# Patient Record
Sex: Female | Born: 1995 | Race: Black or African American | Hispanic: No | Marital: Single | Smoking: Never smoker
Health system: Southern US, Community
[De-identification: ages and names within clinical notes are randomized; demographics above are authoritative.]

---

## 2016-06-30 ENCOUNTER — Encounter (HOSPITAL_COMMUNITY): Payer: Self-pay | Admitting: Emergency Medicine

## 2016-06-30 ENCOUNTER — Ambulatory Visit (HOSPITAL_COMMUNITY)
Admission: EM | Admit: 2016-06-30 | Discharge: 2016-06-30 | Disposition: A | Discharge status: Home / Self Care | Attending: Family Medicine | Admitting: Family Medicine

## 2016-06-30 DIAGNOSIS — H6122 Impacted cerumen, left ear: Secondary | ICD-10-CM | POA: Diagnosis not present

## 2016-06-30 DIAGNOSIS — J209 Acute bronchitis, unspecified: Secondary | ICD-10-CM

## 2016-06-30 DIAGNOSIS — J0100 Acute maxillary sinusitis, unspecified: Secondary | ICD-10-CM

## 2016-06-30 MED ORDER — HYDROCODONE-HOMATROPINE 5-1.5 MG/5ML PO SYRP
5.0000 mL | ORAL_SOLUTION | Freq: Four times a day (QID) | ORAL | 0 refills | Status: DC | PRN
Start: 2016-06-30 — End: 2018-08-23

## 2016-06-30 MED ORDER — AZITHROMYCIN 250 MG PO TABS: 250.0000 mg | ORAL_TABLET | Freq: Every day | ORAL | 0 refills | Status: DC

## 2016-06-30 NOTE — ED Provider Notes (Signed)
[facility]    CSN: [id number] Arrival date & time: 06/30/16  1642  First Provider Contact:  None       History   Chief Complaint Chief Complaint  Patient presents with  . URI    HPI Leah Soto is a 20 y.o. female.   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 She comes in with 10 days of sinus congestion and cough. She has productive phlegm which is either tanner green at times.  She's not had a significant fever, shortness of breath, or history of asthma.      History reviewed. No pertinent past medical history.  There are no active problems to display for this patient.   History reviewed. No pertinent surgical history.  OB History    No data available       Home Medications    Prior to Admission medications   Medication Sig Start Date End Date Taking? Authorizing Provider  OVER THE COUNTER MEDICATION otc cold medicines   Yes Historical Provider, MD  azithromycin (ZITHROMAX) 250 MG tablet Take 1 tablet (250 mg total) by mouth daily. Take first 2 tablets together, then 1 every day until finished. 06/30/16   Elvina SidleKurt Katonya Blecher, MD  HYDROcodone-homatropine [facility](HYCODAN) 5-1.5 MG/5ML syrup Take 5 mLs by mouth every 6 (six) hours as needed for cough. 06/30/16   Elvina SidleKurt Brenner Visconti, MD    Family History Family History  Problem Relation Age of Onset  . Diabetes Maternal Grandmother     Social History Social History  Substance Use Topics  . Smoking status: Never Smoker  . Smokeless tobacco: Never Used  . Alcohol use No     Allergies   Review of patient's allergies indicates no known allergies.   Review of Systems Review of Systems  Constitutional: Negative for fever.  HENT: Positive for congestion and sinus pressure.   Eyes: Negative.   Respiratory: Positive for cough.   Cardiovascular: Negative.   Gastrointestinal: Negative.   Genitourinary: Negative.   Musculoskeletal: Negative.      Physical Exam Triage  Vital Signs ED Triage Vitals [06/30/16 1716]  Enc Vitals Group     BP 117/63     Pulse Rate 71     Resp 12     Temp 98.8 F (37.1 C)     Temp Source Oral     SpO2 100 %     Weight      Height      Head Circumference      Peak Flow      Pain Score      Pain Loc      Pain Edu?      Excl. in GC?    No data found.   Updated Vital Signs BP 117/63 (BP Location: Left Arm)   Pulse 71   Temp 98.8 F (37.1 C) (Oral)   Resp 12   LMP 06/07/2016   SpO2 100%       Physical Exam  Constitutional: She is oriented to person, place, and time. She appears well-developed and well-nourished.  HENT:  Head: Normocephalic.  Right Ear: External ear normal.  Left Ear: External ear normal.  Nose: Nose normal.  Mouth/Throat: Oropharynx is clear and moist.  Left cerumen impaction  Eyes: Conjunctivae and EOM are normal. Pupils are equal, round, and reactive to light.  Neck: Normal range of motion. Neck supple.  Cardiovascular: Normal rate, regular rhythm and normal heart sounds.   Pulmonary/Chest: Effort normal. She has wheezes.  Musculoskeletal: Normal range of motion.  Neurological: She is alert and oriented to person, place, and time.  Skin: Skin is warm and dry.  Psychiatric: She has a normal mood and affect. Her behavior is normal. Judgment and thought content normal.  Nursing note and vitals reviewed.    UC Treatments / Results  Labs (all labs ordered are listed, but only abnormal results are displayed) Labs Reviewed - No data to display  EKG  EKG Interpretation None       Radiology No results found.  Procedures Procedures (including critical care time)  Medications Ordered in UC Medications - No data to display   Initial Impression / Assessment and Plan / UC Course  I have reviewed the triage vital signs and the nursing notes.  Pertinent labs & imaging results that were available during my care of the patient were reviewed by me and considered in my medical  decision making (see chart for details).  Clinical Course      Final Clinical Impressions(s) / UC Diagnoses   Final diagnoses:  Cerumen impaction, left  Acute maxillary sinusitis, recurrence not specified  Acute bronchitis, unspecified organism    New Prescriptions New Prescriptions   AZITHROMYCIN (ZITHROMAX) 250 MG TABLET    Take 1 tablet (250 mg total) by mouth daily. Take first 2 tablets together, then 1 every day until finished.   HYDROCODONE-HOMATROPINE (HYCODAN) 5-1.5 MG/5ML SYRUP    Take 5 mLs by mouth every 6 (six) hours as needed for cough.     Elvina Sidle, MD 06/30/16 [phone]

## 2016-06-30 NOTE — ED Triage Notes (Signed)
Patient reports 1 1/2 weeks of cough, productive cough, green phlegm intermittently.  No known fever.  Reports runny nose and feels symptoms are getting worse.

## 2018-05-08 ENCOUNTER — Emergency Department (HOSPITAL_COMMUNITY)

## 2018-05-08 ENCOUNTER — Emergency Department (HOSPITAL_COMMUNITY)
Admission: EM | Admit: 2018-05-08 | Discharge: 2018-05-08 | Disposition: A | Discharge status: Home / Self Care | Attending: Emergency Medicine | Admitting: Emergency Medicine

## 2018-05-08 ENCOUNTER — Other Ambulatory Visit: Payer: Self-pay

## 2018-05-08 ENCOUNTER — Encounter (HOSPITAL_COMMUNITY): Payer: Self-pay | Admitting: *Deleted

## 2018-05-08 DIAGNOSIS — M25512 Pain in left shoulder: Secondary | ICD-10-CM | POA: Diagnosis not present

## 2018-05-08 NOTE — Discharge Instructions (Addendum)
Take ibuprofen 3 times a day with meals.  Take 800 mg (4 pills) at a time. Do not take other anti-inflammatories at the same time open (Advil, Motrin, naproxen, Aleve). You may supplement with Tylenol if you need further pain control. Use ice packs or heating pads if this helps control your pain. Use muscle creams, such as salon pas, icy hot, or bengay, to help with pain.  Will likely have continued irritation for several days to weeks until the muscles heal. Return to the emergency room if you develop numbness of your hand, start dropping things with your hand, redness/warmth/swelling of your shoulder, or any new or concerning symptoms.

## 2018-05-08 NOTE — ED Provider Notes (Signed)
[facility]-EMERGENCY DEPT Provider Note   CSN: [id number] Arrival date & time: 05/08/18  0103     History   Chief Complaint Chief Complaint  Patient presents with  . Shoulder Pain    HPI Leah Soto is a 22 y.o. female presenting for evaluation of left shoulder pain.  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  Pain is worse with movement and lifting a tray at work.  Improved with rest.  She has not taken anything including Tylenol or ibuprofen.  She denies fall, trauma, or injury.  She denies radiation of the pain.  She has not had pain like this before in the past.  She denies numbness or tingling of the hand.  She is not dropping things with her hand.  She denies radiation into the chest or neck.  She has no medical problems, does not take medications daily.    HPI  History reviewed. No pertinent past medical history.  There are no active problems to display for this patient.   History reviewed. No pertinent surgical history.   OB History   None      Home Medications    Prior to Admission medications   Medication Sig Start Date End Date Taking? Authorizing Provider  azithromycin (ZITHROMAX) 250 MG tablet Take 1 tablet (250 mg total) by mouth daily. Take first 2 tablets together, then 1 every day until finished. 06/30/16   Elvina Sidle, MD  HYDROcodone-homatropine [facility]) 5-1.5 MG/5ML syrup Take 5 mLs by mouth every 6 (six) hours as needed for cough. 06/30/16   Elvina Sidle, MD  OVER THE COUNTER MEDICATION otc cold medicines    [provider]    Family History Family History  Problem Relation Age of Onset  . Diabetes Maternal Grandmother     Social History Social History   Tobacco Use  . Smoking status: Never Smoker  . Smokeless tobacco: Never Used  Substance Use Topics  . Alcohol use: No  . Drug use: No     Allergies   Patient has no known  allergies.   Review of Systems Review of Systems  Musculoskeletal: Positive for myalgias.  Neurological: Negative for numbness.     Physical Exam Updated Vital Signs BP 109/75 (BP Location: Left Arm)   Pulse 90   Temp 98.2 F (36.8 C) (Oral)   Resp 18   Ht 5\' 2"  (1.575 m)   Wt 68 kg (150 lb)   LMP 04/11/2018   SpO2 100%   BMI 27.44 kg/m   Physical Exam  Constitutional: She is oriented to person, place, and time. She appears well-developed and well-nourished. No distress.  HENT:  Head: Normocephalic and atraumatic.  Eyes: EOM are normal.  Neck: Normal range of motion.  Cardiovascular: Normal rate, regular rhythm and intact distal pulses.  Pulmonary/Chest: Effort normal and breath sounds normal. No respiratory distress. She has no wheezes.  Abdominal: She exhibits no distension.  Musculoskeletal: Normal range of motion. She exhibits tenderness. She exhibits no edema or deformity.  No obvious deformity, swelling, redness to the left shoulder.  Mild tenderness palpation of the anterior musculature of the left shoulder.  No tenderness palpation over the bony protuberances.  Full active range of motion without difficulty.  Grip strength intact bilaterally.  Radial pulses intact bilaterally.  Sensation intact bilaterally.  Neurological: She is alert and oriented to person, place, and time.  Skin: Skin is warm. Capillary refill  takes less than 2 seconds. No rash noted.  Psychiatric: She has a normal mood and affect.  Nursing note and vitals reviewed.    ED Treatments / Results  Labs (all labs ordered are listed, but only abnormal results are displayed) Labs Reviewed - No data to display  EKG None  Radiology Dg Shoulder Left  Result Date: 05/08/2018 CLINICAL DATA:  LEFT anterior shoulder pain at work tonight. EXAM: LEFT SHOULDER - 2+ VIEW COMPARISON:  None. FINDINGS: The humeral head is well-formed and located. The subacromial, glenohumeral and acromioclavicular joint  spaces are intact. No destructive bony lesions. Soft tissue planes are non-suspicious. IMPRESSION: Negative. Electronically Signed   By: Awilda Metroourtnay  Bloomer M.D.   On: 05/08/2018 02:01    Procedures Procedures (including critical care time)  Medications Ordered in ED Medications - No data to display   Initial Impression / Assessment and Plan / ED Course  I have reviewed the triage vital signs and the nursing notes.  Pertinent labs & imaging results that were available during my care of the patient were reviewed by me and considered in my medical decision making (see chart for details).     Patient presenting for evaluation of left shoulder pain.  Physical exam reassuring, she is neurovascularly intact.  Pain is reproducible with palpation of the musculature.  X-ray read interpreted by me, no fractures or dislocations.  As such, symptoms are likely due to muscular causes.  No erythema, warmth, or fevers, doubt septic joint.  Discussed findings with patient.  Discussed treatment with anti-inflammatories and muscle creams.  Discussed etiology and course of muscle pain.  At this time, patient appears safe for discharge.  Return precautions given.  Patient states she understands and agrees plan.   Final Clinical Impressions(s) / ED Diagnoses   Final diagnoses:  Acute pain of left shoulder    ED Discharge Orders    None       Alveria ApleyCaccavale, Bonniejean Piano, PA-C 05/08/18 0529    Molpus, Jonny RuizJohn, MD 05/08/18 [phone]

## 2018-05-08 NOTE — ED Triage Notes (Signed)
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 No meds PTA.

## 2018-08-23 ENCOUNTER — Other Ambulatory Visit: Payer: Self-pay

## 2018-08-23 ENCOUNTER — Ambulatory Visit (HOSPITAL_COMMUNITY)
Admission: EM | Admit: 2018-08-23 | Discharge: 2018-08-23 | Disposition: A | Discharge status: Home / Self Care | Attending: Family Medicine | Admitting: Family Medicine

## 2018-08-23 ENCOUNTER — Encounter (HOSPITAL_COMMUNITY): Payer: Self-pay | Admitting: Emergency Medicine

## 2018-08-23 ENCOUNTER — Ambulatory Visit (INDEPENDENT_AMBULATORY_CARE_PROVIDER_SITE_OTHER)

## 2018-08-23 DIAGNOSIS — M25562 Pain in left knee: Secondary | ICD-10-CM

## 2018-08-23 MED ORDER — NAPROXEN 500 MG PO TABS: 500.0000 mg | ORAL_TABLET | Freq: Two times a day (BID) | ORAL | 0 refills | Status: AC

## 2018-08-23 NOTE — Discharge Instructions (Addendum)
Your pain could be associated with a meniscus or ligament injury The x ray was negative here We will try a knee sleeve, ice, elevation of the knee.  Naproxen for pain and inflammation. If no improvement in the next few weeks you will need to follow up with orthopedics.

## 2018-08-23 NOTE — ED Provider Notes (Signed)
[facility]    CSN: [id number] Arrival date & time: 08/23/18  1008     History   Chief Complaint Chief Complaint  Patient presents with  . Knee Pain    Appt. 10:00    HPI Leah Soto is a 22 y.o. female.   Patient is a 22 year old female who presents for left anterior knee pain that started approximately 1 week ago.  This is been waxing and waning x1 week.  She localizes the pain to the medial and lateral aspect of the knee.  She denies any injury to the knee.  She has not taken anything for symptoms.  She does report that she is on her feet a lot at work and does a lot of bending, stooping.  The pain is worse with squatting, walking upstairs.  She does hear some popping and clicking at times.  She can feel the pain at night even when she is lying in bed.  She describes this as crampy feeling.  She denies any numbness, tingling, radiation of pain.  She denies any prior history of knee problems.  ROS per HPI      History reviewed. No pertinent past medical history.  There are no active problems to display for this patient.   History reviewed. No pertinent surgical history.  OB History   None      Home Medications    Prior to Admission medications   Medication Sig Start Date End Date Taking? Authorizing Provider  naproxen (NAPROSYN) 500 MG tablet Take 1 tablet (500 mg total) by mouth 2 (two) times daily. 08/23/18   Janace ArisBast, Diem Pagnotta A, NP    Family History Family History  Problem Relation Age of Onset  . Diabetes Maternal Grandmother     Social History Social History   Tobacco Use  . Smoking status: Never Smoker  . Smokeless tobacco: Never Used  Substance Use Topics  . Alcohol use: No  . Drug use: No     Allergies   Patient has no known allergies.   Review of Systems Review of Systems   Physical Exam Triage Vital Signs ED Triage Vitals [08/23/18 1025]  Enc Vitals Group     BP      Pulse      Resp      Temp      Temp src    SpO2      Weight      Height      Head Circumference      Peak Flow      Pain Score 6     Pain Loc      Pain Edu?      Excl. in GC?    No data found.  Updated Vital Signs BP 114/69 (BP Location: Right Arm)   Pulse 83   Temp 97.6 F (36.4 C) (Oral)   LMP 07/27/2018 (Approximate) Comment: denies pregnancy  SpO2 100%   Visual Acuity Right Eye Distance:   Left Eye Distance:   Bilateral Distance:    Right Eye Near:   Left Eye Near:    Bilateral Near:     Physical Exam  Constitutional: She appears well-developed and well-nourished.  HENT:  Head: Normocephalic and atraumatic.  Eyes: Conjunctivae are normal.  Neck: Normal range of motion.  Pulmonary/Chest: Effort normal.  Musculoskeletal: Normal range of motion. She exhibits no edema, tenderness or deformity.  Good range of motion with left knee.  More pain with extension of knee.  Nontender to palpation.  No obvious swelling, erythema or deformities. Negative varus, valgus.  No clicking or popping. Negative McMurray  No posterior knee pain  Neurological: She is alert.  Skin: Skin is warm and dry.  Psychiatric: She has a normal mood and affect.  Nursing note and vitals reviewed.    UC Treatments / Results  Labs (all labs ordered are listed, but only abnormal results are displayed) Labs Reviewed - No data to display  EKG None  Radiology Dg Knee Complete 4 Views Left  Result Date: 08/23/2018 CLINICAL DATA:  Left knee pain for approximately 1 week. No acute injury or prior relevant surgery. EXAM: LEFT KNEE - COMPLETE 4+ VIEW COMPARISON:  None. FINDINGS: The mineralization and alignment are normal. There is no evidence of acute fracture or dislocation. The joint spaces are maintained. No joint effusion or focal soft tissue abnormality identified. IMPRESSION: Normal examination. Electronically Signed   By: Carey Bullocks M.D.   On: 08/23/2018 11:18    Procedures Procedures (including critical care  time)  Medications Ordered in UC Medications - No data to display  Initial Impression / Assessment and Plan / UC Course  I have reviewed the triage vital signs and the nursing notes.  Pertinent labs & imaging results that were available during my care of the patient were reviewed by me and considered in my medical decision making (see chart for details).     X-ray did not reveal any abnormalities We will have patient rest, ice, elevate Knee sleeve given in clinic Naproxen for pain inflammation For continued worsening symptoms follow-up with orthopedic.  Contact given for [facility] Final Clinical Impressions(s) / UC Diagnoses   Final diagnoses:  Acute pain of left knee     Discharge Instructions     Your pain could be associated with a meniscus or ligament injury The x ray was negative here We will try a knee sleeve, ice, elevation of the knee.  Naproxen for pain and inflammation. If no improvement in the next few weeks you will need to follow up with orthopedics.     ED Prescriptions    Medication Sig Dispense Auth. Provider   naproxen (NAPROSYN) 500 MG tablet Take 1 tablet (500 mg total) by mouth 2 (two) times daily. 30 tablet Dahlia Byes A, NP     Controlled Substance Prescriptions [place] Controlled Substance Registry consulted? Not Applicable   Janace Aris, NP 08/23/18 1132

## 2018-08-23 NOTE — ED Triage Notes (Signed)
Pt reports left anterior knee pain that started one week ago not related to any injury.  Pt reports more pain when lying in bed at night.  She feels like it wants to pop out of place but it does not.

## 2019-10-31 IMAGING — DX DG KNEE COMPLETE 4+V*L*
4 series · 4 of 4 positions shown · non-contrast
Comparison: None.

CLINICAL DATA: Left knee pain for approximately 1 week. No acute
injury or prior relevant surgery.

EXAM:
LEFT KNEE - COMPLETE 4+ VIEW

[knee ap]
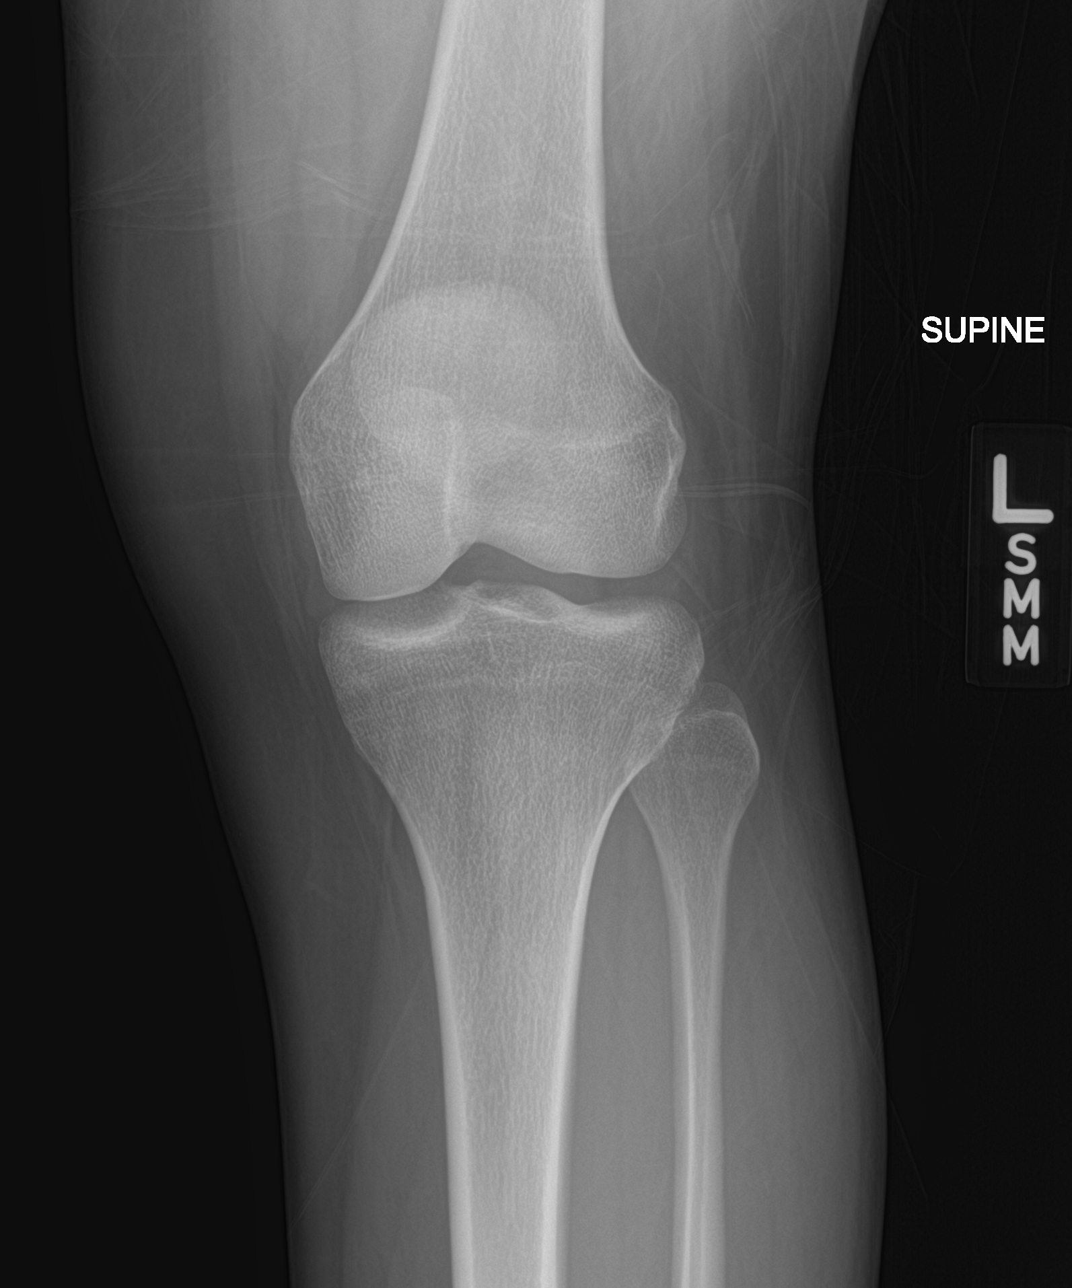

[knee obl (1 of 2)]
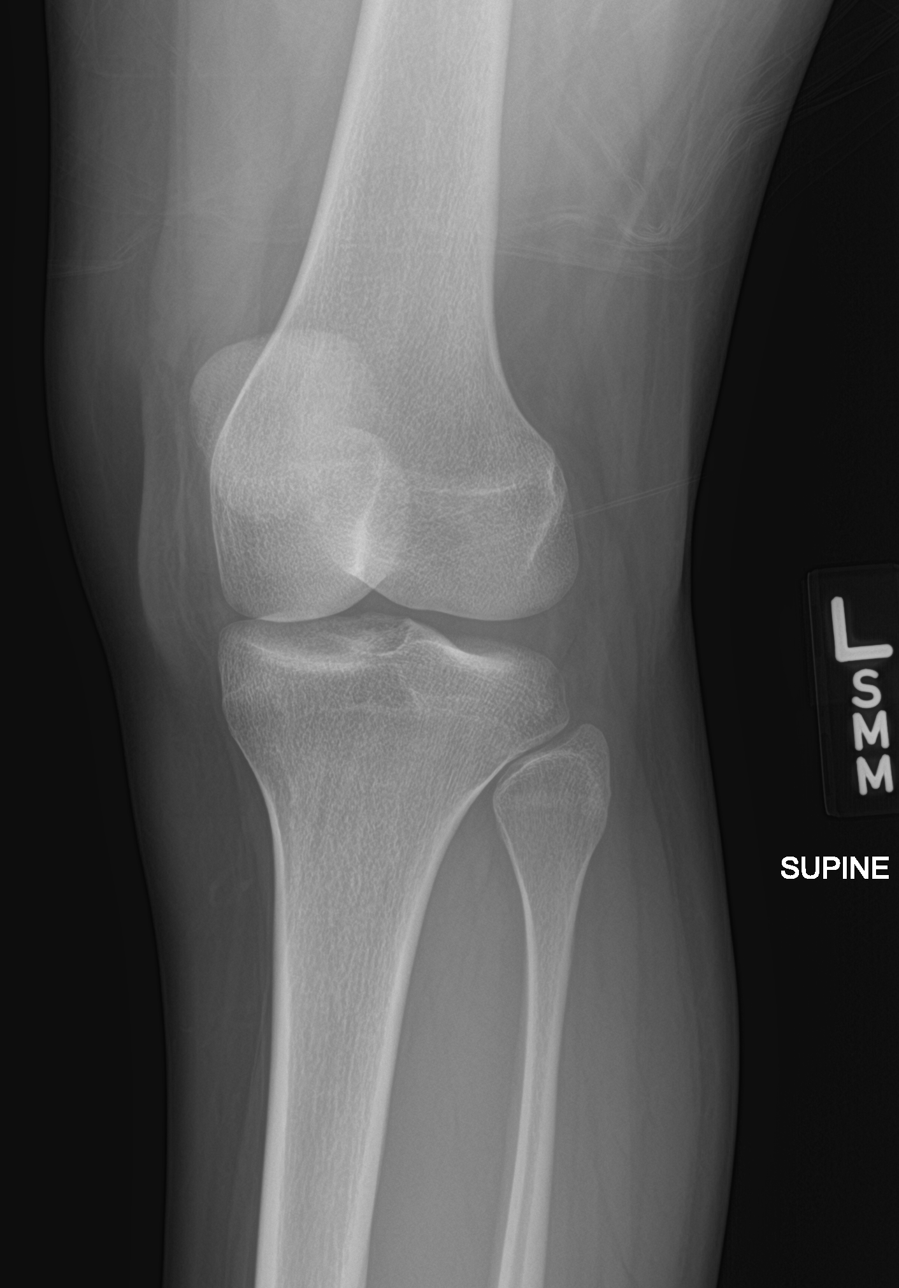

[knee obl (2 of 2)]
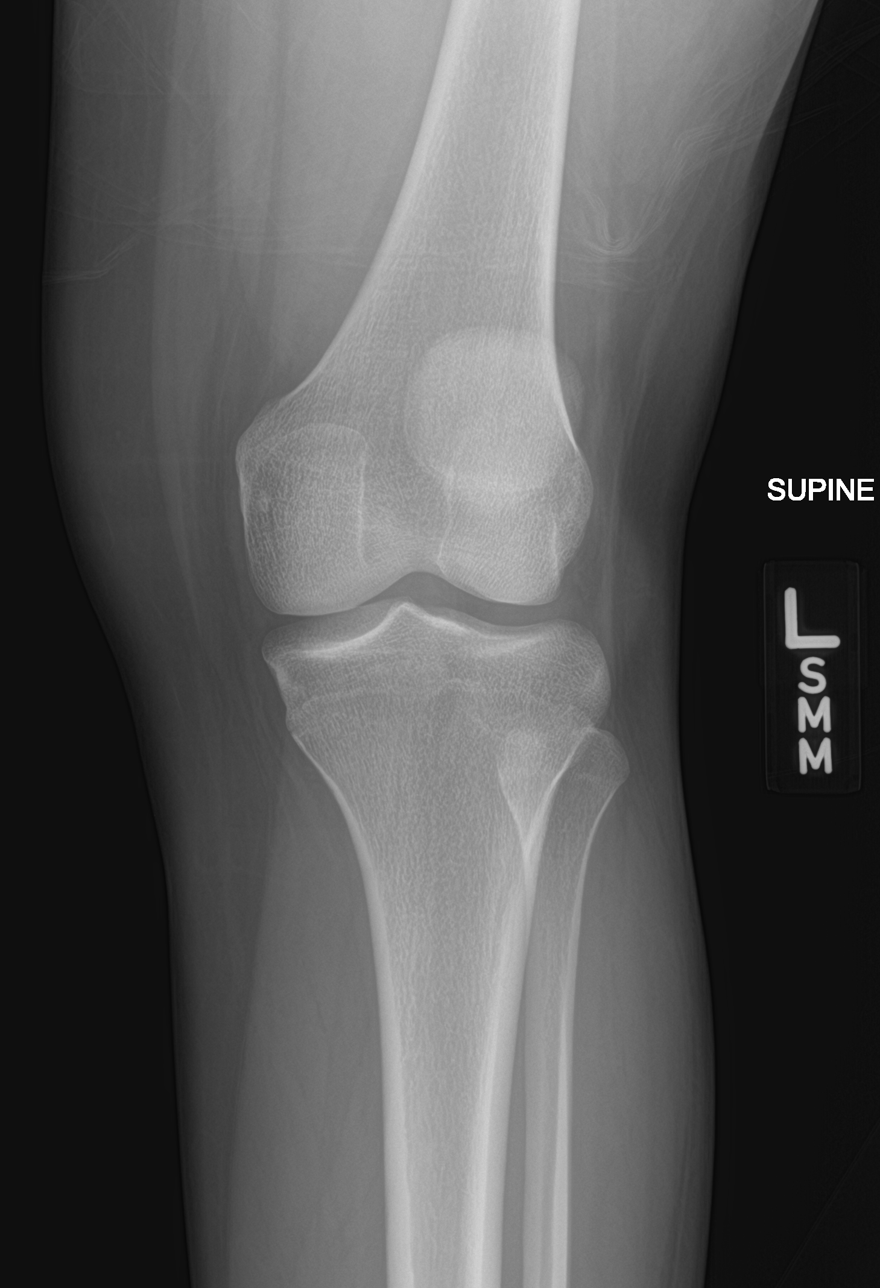

[knee lat]
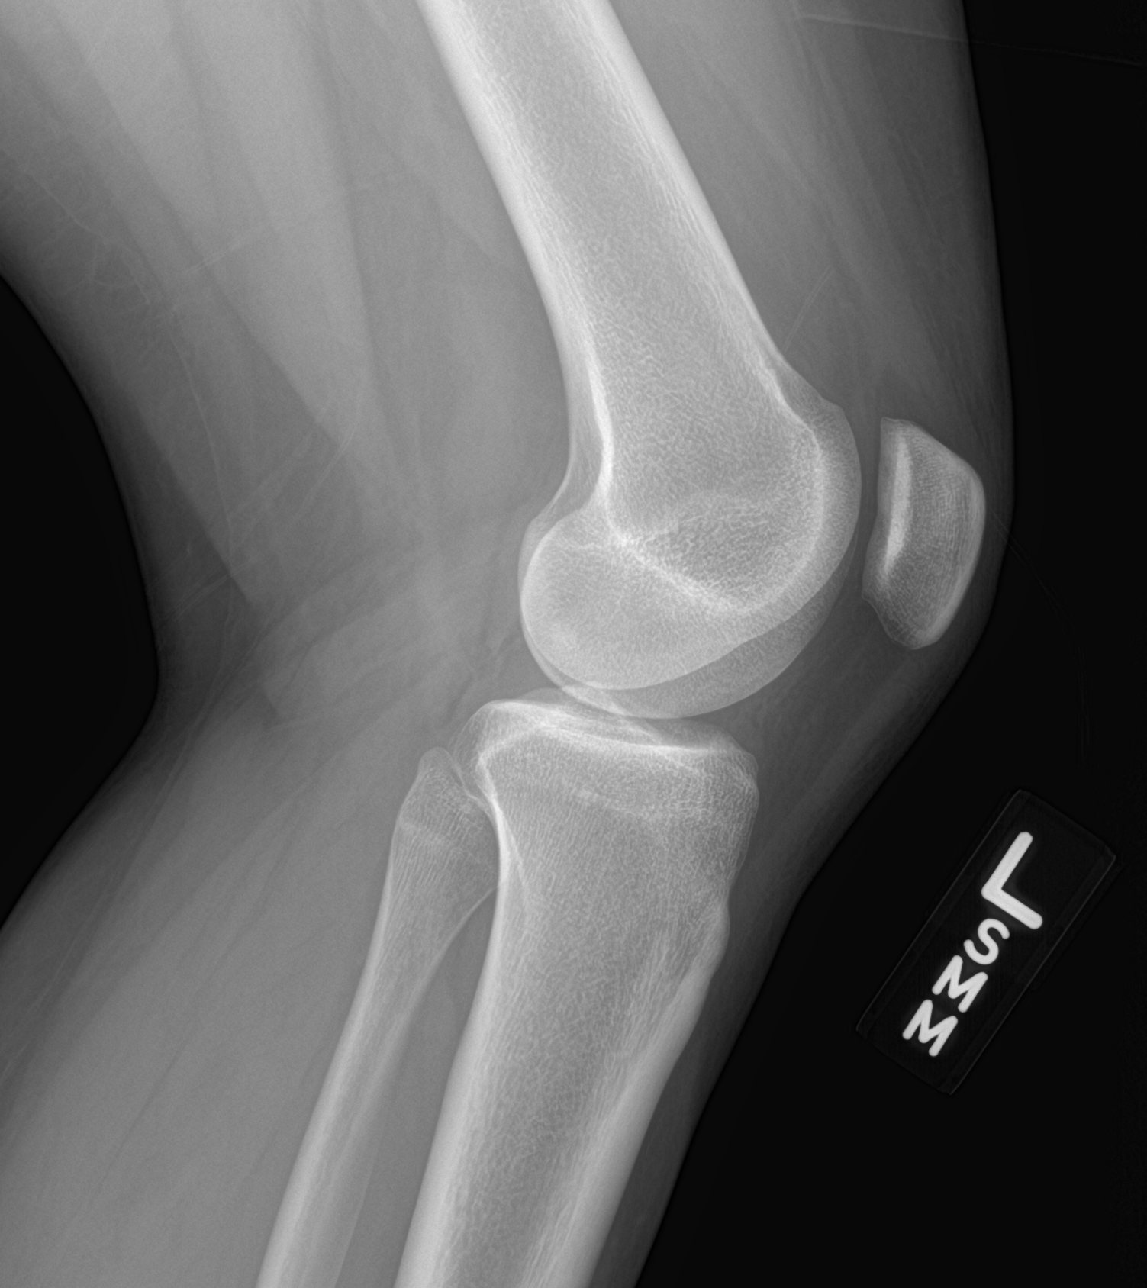

[4 of 4 positions shown; findings below may reference images not displayed]

FINDINGS: The mineralization and alignment are normal. There is no evidence of
acute fracture or dislocation. The joint spaces are maintained. No
joint effusion or focal soft tissue abnormality identified.
IMPRESSION: Normal examination.
